# Patient Record
Sex: Male | Born: 1980
Health system: Southern US, Community
[De-identification: ages and names within clinical notes are randomized; demographics above are authoritative.]

## PROBLEM LIST (undated history)

## (undated) DIAGNOSIS — F419 Anxiety disorder, unspecified: Secondary | ICD-10-CM

## (undated) DIAGNOSIS — E039 Hypothyroidism, unspecified: Secondary | ICD-10-CM

---

## 2006-03-03 ENCOUNTER — Ambulatory Visit (HOSPITAL_COMMUNITY): Admission: RE | Admit: 2006-03-03 | Discharge: 2006-03-03 | Payer: Self-pay | Admitting: Family Medicine

## 2010-06-01 ENCOUNTER — Emergency Department (HOSPITAL_COMMUNITY)
Admission: EM | Admit: 2010-06-01 | Discharge: 2010-06-01 | Disposition: A | Discharge status: Home / Self Care | Payer: BC Managed Care – PPO | Attending: Emergency Medicine | Admitting: Emergency Medicine

## 2010-06-01 DIAGNOSIS — H6500 Acute serous otitis media, unspecified ear: Secondary | ICD-10-CM | POA: Insufficient documentation

## 2010-06-01 DIAGNOSIS — H9209 Otalgia, unspecified ear: Secondary | ICD-10-CM | POA: Insufficient documentation

## 2010-06-01 DIAGNOSIS — H60399 Other infective otitis externa, unspecified ear: Secondary | ICD-10-CM | POA: Insufficient documentation

## 2010-06-26 ENCOUNTER — Other Ambulatory Visit: Payer: Self-pay | Admitting: Urology

## 2010-06-26 ENCOUNTER — Other Ambulatory Visit (HOSPITAL_COMMUNITY)
Admission: RE | Admit: 2010-06-26 | Discharge: 2010-06-26 | Disposition: A | Discharge status: Home / Self Care | Payer: BC Managed Care – PPO | Source: Ambulatory Visit | Attending: Urology | Admitting: Urology

## 2010-06-26 DIAGNOSIS — Z9852 Vasectomy status: Secondary | ICD-10-CM | POA: Insufficient documentation

## 2011-03-23 ENCOUNTER — Emergency Department (HOSPITAL_COMMUNITY)
Admission: EM | Admit: 2011-03-23 | Discharge: 2011-03-23 | Disposition: A | Discharge status: Home / Self Care | Payer: Worker's Compensation | Attending: Emergency Medicine | Admitting: Emergency Medicine

## 2011-03-23 ENCOUNTER — Encounter: Payer: Self-pay | Admitting: Emergency Medicine

## 2011-03-23 ENCOUNTER — Emergency Department (HOSPITAL_COMMUNITY): Payer: Worker's Compensation

## 2011-03-23 DIAGNOSIS — W230XXA Caught, crushed, jammed, or pinched between moving objects, initial encounter: Secondary | ICD-10-CM | POA: Insufficient documentation

## 2011-03-23 DIAGNOSIS — Z23 Encounter for immunization: Secondary | ICD-10-CM | POA: Insufficient documentation

## 2011-03-23 DIAGNOSIS — S8010XA Contusion of unspecified lower leg, initial encounter: Secondary | ICD-10-CM

## 2011-03-23 DIAGNOSIS — S81819A Laceration without foreign body, unspecified lower leg, initial encounter: Secondary | ICD-10-CM

## 2011-03-23 DIAGNOSIS — S81009A Unspecified open wound, unspecified knee, initial encounter: Secondary | ICD-10-CM | POA: Insufficient documentation

## 2011-03-23 DIAGNOSIS — S71109A Unspecified open wound, unspecified thigh, initial encounter: Secondary | ICD-10-CM | POA: Insufficient documentation

## 2011-03-23 DIAGNOSIS — S71009A Unspecified open wound, unspecified hip, initial encounter: Secondary | ICD-10-CM | POA: Insufficient documentation

## 2011-03-23 DIAGNOSIS — J45909 Unspecified asthma, uncomplicated: Secondary | ICD-10-CM | POA: Insufficient documentation

## 2011-03-23 DIAGNOSIS — S7010XA Contusion of unspecified thigh, initial encounter: Secondary | ICD-10-CM | POA: Insufficient documentation

## 2011-03-23 DIAGNOSIS — T148XXA Other injury of unspecified body region, initial encounter: Secondary | ICD-10-CM

## 2011-03-23 DIAGNOSIS — Y9269 Other specified industrial and construction area as the place of occurrence of the external cause: Secondary | ICD-10-CM | POA: Insufficient documentation

## 2011-03-23 MED ORDER — HYDROCODONE-ACETAMINOPHEN 5-500 MG PO TABS: 1.0000 | ORAL_TABLET | Freq: Four times a day (QID) | ORAL | Status: AC | PRN

## 2011-03-23 MED ORDER — IBUPROFEN 800 MG PO TABS: 800.0000 mg | ORAL_TABLET | Freq: Three times a day (TID) | ORAL | Status: AC

## 2011-03-23 MED ORDER — HYDROCODONE-ACETAMINOPHEN 5-325 MG PO TABS
1.0000 | ORAL_TABLET | Freq: Once | ORAL | Status: AC
  Administered 2011-03-23: 1 via ORAL
  Filled 2011-03-23: qty 1

## 2011-03-23 MED ORDER — TETANUS-DIPHTH-ACELL PERTUSSIS 5-2.5-18.5 LF-MCG/0.5 IM SUSP
0.5000 mL | Freq: Once | INTRAMUSCULAR | Status: AC
  Administered 2011-03-23: 0.5 mL via INTRAMUSCULAR
  Filled 2011-03-23 (×2): qty 0.5

## 2011-03-23 MED ORDER — IBUPROFEN 800 MG PO TABS
800.0000 mg | ORAL_TABLET | Freq: Once | ORAL | Status: AC
  Administered 2011-03-23: 800 mg via ORAL
  Filled 2011-03-23: qty 1

## 2011-03-23 NOTE — ED Notes (Signed)
Per pt, pt works for [employer], Warehouse manager, Nathaniel Dawson at bedside with pt - states company does not require drug screen.

## 2011-03-23 NOTE — ED Notes (Signed)
Worker's comp form filled out by [employer], reviewed with pt and pt signed, verbalized understanding.  Copy of form given to employer, pt, and copy placed in chart.

## 2011-03-23 NOTE — ED Notes (Signed)
Dressing applied to wounds on left leg, tefla, and stretch gauze applied. No drainage noted.

## 2011-03-23 NOTE — ED Provider Notes (Signed)
History    This chart was scribed for [employer]. Colon Branch, MD, MD by Smitty Pluck. The patient was seen in room APA10 and the patient's care was started at 8:05AM.   CSN: [id number] Arrival date & time: 03/23/2011  7:32 AM   First MD Initiated Contact with Patient 03/23/11 0747      Chief Complaint  Patient presents with  . Leg Injury    (Consider location/radiation/quality/duration/timing/severity/associated sxs/prior treatment) The history is provided by the patient and a parent.   Nathaniel Dawson is a 30 y.o. male who presents to the Emergency Department BIB ambulance complaining of left thigh injury after leg was pressed against sheet metal on crane at work onset 1 hour ago today. Pt's pain is moderate and numbness in left leg. Pt's father states that tetanus shot is not up to date. Pt reports that he is able to walk but walking aggravates the pain. PCP is Dr. Renette Butters  Past Medical History  Diagnosis Date  . Asthma     History reviewed. No pertinent past surgical history.  History reviewed. No pertinent family history.  History  Substance Use Topics  . Smoking status: Never Smoker   . Smokeless tobacco: Not on file  . Alcohol Use: No      Review of Systems  All other systems reviewed and are negative.   10 Systems reviewed and are negative for acute change except as noted in the HPI.  Allergies  Aspirin  Home Medications  No current outpatient prescriptions on file.  BP 135/83  Pulse 81  Temp 97.9 F (36.6 C)  Resp 20  Ht 6' (1.829 m)  Wt 150 lb (68.04 kg)  BMI 20.34 kg/m2  SpO2 97%  Physical Exam  Nursing note and vitals reviewed. Constitutional: He is oriented to person, place, and time. He appears well-developed and well-nourished. No distress.  HENT:  Head: Normocephalic and atraumatic.  Right Ear: External ear normal.  Left Ear: External ear normal.  Eyes: EOM are normal. Pupils are equal, round, and reactive to light.  Neck: Normal range of  motion. Neck supple. No tracheal deviation present.  Cardiovascular: Normal rate and normal heart sounds.   Pulmonary/Chest: Effort normal and breath sounds normal. No respiratory distress. He has no wheezes.  Abdominal: Soft. Bowel sounds are normal. He exhibits no distension. There is no tenderness.  Musculoskeletal: Normal range of motion. He exhibits no edema.       Directly 6 cm horizontal superficial laceration behind left knee 18 cm superficial laceration with surrounding bruising on right thigh  DP and DT pulses good Full  range of motion hip knee joint  Neurological: He is alert and oriented to person, place, and time.  Skin: Skin is warm and dry.  Psychiatric: He has a normal mood and affect. His behavior is normal.    ED Course  Procedures (including critical care time)  DIAGNOSTIC STUDIES: Oxygen Saturation is 97% on room air, normal by my interpretation.    COORDINATION OF CARE: 9:32Am Recheck: Dr discussed xray results and treatment course. Pt is feeling better and understands treatment plan. Pt is ready for discharge.   Labs Reviewed - No data to display Dg Femur Left  03/23/2011  *RADIOLOGY REPORT*  Clinical Data: Crush injury to the left upper leg.  Bruising.  LEFT FEMUR - 2 VIEW  Comparison: None.  Findings: No fracture or other significant abnormality.  IMPRESSION: Normal exam.  Original Report Authenticated By: Gwynn Burly, M.D.  MDM  Patient with contusion to thigh as a result of crush injury. Superficiall lacerations that do not require repair. Xray negative for acute bone injury. Pt feels improved after observation and/or treatment in ED.Pt stable in ED with no significant deterioration in condition.The patient appears reasonably screened and/or stabilized for discharge and I doubt any other medical condition or other [facility] requiring further screening, evaluation, or treatment in the ED at this time prior to discharge.  I personally performed the  services described in this documentation, which was scribed in my presence. The recorded information has been reviewed and considered.  MDM Reviewed: nursing note and vitals Interpretation: x-ray         Nicoletta Dress. Colon Branch, MD 03/23/11 1001

## 2011-03-23 NOTE — ED Notes (Signed)
MD at bedside. 

## 2011-03-23 NOTE — ED Notes (Signed)
Pt c/o left thigh pain after leg was pressed between object on crane and metal sheets. Pt ambulated into ed. nad noted.

## 2011-03-23 NOTE — ED Notes (Signed)
Pt reports hitting his left leg on a piece of metal this morning at work. Pt with abrasions to posterior side of leg. Pt reports numbness and tingling down left left. Pt with steady gait. Pt reports pain 8/10 and states that pain is relieved by sitting.

## 2011-03-23 NOTE — ED Notes (Signed)
Patient transported to X-ray 

## 2014-09-13 ENCOUNTER — Encounter (HOSPITAL_COMMUNITY): Payer: Self-pay

## 2014-09-13 ENCOUNTER — Emergency Department (HOSPITAL_COMMUNITY)
Admission: EM | Admit: 2014-09-13 | Discharge: 2014-09-13 | Disposition: A | Discharge status: Home / Self Care | Payer: BLUE CROSS/BLUE SHIELD | Attending: Emergency Medicine | Admitting: Emergency Medicine

## 2014-09-13 DIAGNOSIS — Z79899 Other long term (current) drug therapy: Secondary | ICD-10-CM | POA: Diagnosis not present

## 2014-09-13 DIAGNOSIS — B029 Zoster without complications: Secondary | ICD-10-CM | POA: Insufficient documentation

## 2014-09-13 DIAGNOSIS — J45909 Unspecified asthma, uncomplicated: Secondary | ICD-10-CM | POA: Insufficient documentation

## 2014-09-13 DIAGNOSIS — R079 Chest pain, unspecified: Secondary | ICD-10-CM | POA: Insufficient documentation

## 2014-09-13 DIAGNOSIS — R21 Rash and other nonspecific skin eruption: Secondary | ICD-10-CM | POA: Diagnosis present

## 2014-09-13 MED ORDER — HYDROCODONE-ACETAMINOPHEN 5-325 MG PO TABS: ORAL_TABLET | ORAL | Status: DC

## 2014-09-13 MED ORDER — ACYCLOVIR 800 MG PO TABS
800.0000 mg | ORAL_TABLET | Freq: Once | ORAL | Status: AC
  Administered 2014-09-13: 800 mg via ORAL
  Filled 2014-09-13: qty 1

## 2014-09-13 MED ORDER — ACYCLOVIR 800 MG PO TABS: 800.0000 mg | ORAL_TABLET | Freq: Every day | ORAL | Status: DC

## 2014-09-13 NOTE — Discharge Instructions (Signed)

## 2014-09-13 NOTE — ED Notes (Signed)
Patient c/o rash and red area to left rib/chest. Also c/o discomfort to that area as well.

## 2014-09-15 NOTE — ED Provider Notes (Signed)
CSN: [id number]     Arrival date & time 09/13/14  2014 History   First MD Initiated Contact with Patient 09/13/14 2035     Chief Complaint  Patient presents with  . Rash     (Consider location/radiation/quality/duration/timing/severity/associated sxs/prior Treatment) HPI   Nathaniel Dawson is a 34 y.o. male who presents to the Emergency Department complaining of sudden onset of painful rash to the left mid back and left mid chest.  He notes having pain to his ribs that began one day prior to onset of rash.  He denies itching, but describes the rash as intermittent sharp, "pulling" type pain.  Nothing makes the pain better or worse.  He denies cough, fever, abdominal pain or shortness of breath.     Past Medical History  Diagnosis Date  . Asthma    History reviewed. No pertinent past surgical history. History reviewed. No pertinent family history. History  Substance Use Topics  . Smoking status: Never Smoker   . Smokeless tobacco: Not on file  . Alcohol Use: No    Review of Systems  Constitutional: Negative for fever, chills, activity change and appetite change.  HENT: Negative for facial swelling, sore throat and trouble swallowing.   Respiratory: Negative for chest tightness, shortness of breath and wheezing.   Cardiovascular: Positive for chest pain.  Gastrointestinal: Negative for nausea and vomiting.  Musculoskeletal: Negative for neck pain and neck stiffness.  Skin: Positive for rash. Negative for wound.  Neurological: Negative for dizziness, weakness, numbness and headaches.  All other systems reviewed and are negative.     Allergies  Aspirin  Home Medications   Prior to Admission medications   Medication Sig Start Date End Date Taking? Authorizing Provider  acyclovir (ZOVIRAX) 800 MG tablet Take 1 tablet (800 mg total) by mouth 5 (five) times daily. For 10 days 09/13/14   Shadeed Colberg, PA-C  albuterol (PROVENTIL HFA;VENTOLIN HFA) 108 (90 BASE) MCG/ACT  inhaler Inhale 2 puffs into the lungs every 6 (six) hours as needed. For shortness of  breath     Historical Provider, MD  diphenhydrAMINE (BENADRYL) 25 MG tablet Take 25 mg by mouth every 6 (six) hours as needed. For allergies     Historical Provider, MD  HYDROcodone-acetaminophen (NORCO/VICODIN) 5-325 MG per tablet Take one-two tabs po q 4-6 hrs prn pain 09/13/14   Bela Nyborg, PA-C   BP 151/98 mmHg  Pulse 105  Temp(Src) 98.3 F (36.8 C) (Oral)  Resp 20  Wt 140 lb (63.504 kg)  SpO2 99% Physical Exam  Constitutional: He is oriented to person, place, and time. He appears well-developed and well-nourished. No distress.  HENT:  Head: Normocephalic and atraumatic.  Mouth/Throat: Oropharynx is clear and moist.  Neck: Normal range of motion. Neck supple.  Cardiovascular: Normal rate, regular rhythm, normal heart sounds and intact distal pulses.   No murmur heard. Pulmonary/Chest: Effort normal and breath sounds normal. No respiratory distress.  Abdominal: Soft. He exhibits no distension. There is no tenderness. There is no rebound and no guarding.  Musculoskeletal: He exhibits no edema or tenderness.  Lymphadenopathy:    He has no cervical adenopathy.  Neurological: He is alert and oriented to person, place, and time. He exhibits normal muscle tone. Coordination normal.  Skin: Skin is warm. Rash noted. There is erythema.  Grouped erythematous vesicular rash at the mid left back and left mid anterior chest.    Nursing note and vitals reviewed.   ED Course  Procedures (including critical care time)  Labs Review Labs Reviewed - No data to display  Imaging Review No results found.   EKG Interpretation None      MDM   Final diagnoses:  Herpes zoster    Pt is well appearing.  Vitals stable.  Rash appears c/w herpes zoster.  Pt agrees to tx with anti-viral and pain medication, pt also agrees to close PMD f/u.      Pauline Ausammy Inanna Telford, PA-C 09/15/14 2324  Donnetta HutchingBrian Cook,  MD 09/15/14 2337

## 2015-07-30 ENCOUNTER — Ambulatory Visit (INDEPENDENT_AMBULATORY_CARE_PROVIDER_SITE_OTHER): Payer: BLUE CROSS/BLUE SHIELD | Admitting: Family Medicine

## 2015-07-30 ENCOUNTER — Encounter: Payer: Self-pay | Admitting: Family Medicine

## 2015-07-30 VITALS — BP 120/80 | Temp 98.5°F | Wt 143.0 lb

## 2015-07-30 DIAGNOSIS — J029 Acute pharyngitis, unspecified: Secondary | ICD-10-CM

## 2015-07-30 DIAGNOSIS — J020 Streptococcal pharyngitis: Secondary | ICD-10-CM | POA: Diagnosis not present

## 2015-07-30 LAB — POCT RAPID STREP A (OFFICE): Rapid Strep A Screen: POSITIVE — AB

## 2015-07-30 MED ORDER — AZITHROMYCIN 250 MG PO TABS: ORAL_TABLET | ORAL | Status: DC

## 2015-07-30 NOTE — Progress Notes (Signed)
   Subjective:    Patient ID: Nathaniel Dawson, male    DOB: 10/18/1980, 35 y.o.   MRN: [id number]  Sore Throat  This is a new problem. The current episode started in the past 7 days. The problem has been unchanged. Neither side of throat is experiencing more pain than the other. There has been no fever. The pain is moderate. Associated symptoms include ear pain. He has tried nothing for the symptoms. The treatment provided no relief.   Four d ago, some swelling with swallowing and some discomfort  No sig cough  Works shop work loading trucks   No fever no chills some disc with swallowing  Results for orders placed or performed in visit on 07/30/15  POCT rapid strep A  Result Value Ref Range   Rapid Strep A Screen Positive (A) Negative    Tooth brush exposure with know kn strep    Review of Systems  HENT: Positive for ear pain.    no vomiting no diarrhea     Objective:   Physical Exam   alert vital stable hydration good H&T normal  Other thanpharynx slight erythematous some tender anterior nodes neck supple lungs clear. Heart regular in rhythm.      Assessment & Plan:   impression acute strep throat multiple questions answered plan antibiotics prescribed. Symptom care discussed warning signs discuss

## 2015-08-05 ENCOUNTER — Other Ambulatory Visit: Payer: Self-pay | Admitting: *Deleted

## 2015-08-05 ENCOUNTER — Telehealth: Payer: Self-pay | Admitting: Family Medicine

## 2015-08-05 MED ORDER — AMOXICILLIN 500 MG PO CAPS: 500.0000 mg | ORAL_CAPSULE | Freq: Three times a day (TID) | ORAL | Status: DC

## 2015-08-05 NOTE — Telephone Encounter (Signed)
Pt was seen last Tuesday for strep. Patient was given a zpac. Pt states that it is now worse and wants to know if something else can be called in.      [facility]

## 2015-08-05 NOTE — Telephone Encounter (Signed)
amox 500 tid ten d 

## 2015-08-05 NOTE — Telephone Encounter (Signed)
Seen 4/11. Positive for strep. Pt finished zpack on Saturday. Feels about the same as when seen. Never had a sore throat. Headache and swollen tonsils. Little cough, no congestion, no fever, no trouble breathing.

## 2015-08-05 NOTE — Telephone Encounter (Signed)
Med sent to pharm. Pt notified.  

## 2015-08-23 ENCOUNTER — Ambulatory Visit (INDEPENDENT_AMBULATORY_CARE_PROVIDER_SITE_OTHER): Payer: BLUE CROSS/BLUE SHIELD | Admitting: Family Medicine

## 2015-08-23 ENCOUNTER — Encounter: Payer: Self-pay | Admitting: Family Medicine

## 2015-08-23 VITALS — BP 108/62 | Wt 141.6 lb

## 2015-08-23 DIAGNOSIS — R5383 Other fatigue: Secondary | ICD-10-CM

## 2015-08-23 DIAGNOSIS — E039 Hypothyroidism, unspecified: Secondary | ICD-10-CM

## 2015-08-23 DIAGNOSIS — Z13220 Encounter for screening for lipoid disorders: Secondary | ICD-10-CM | POA: Diagnosis not present

## 2015-08-23 NOTE — Progress Notes (Signed)
   Subjective:    Patient ID: Nathaniel Dawson, male    DOB: 09/07/1980, 35 y.o.   MRN: [id number] Patient presents for a protracted discussion regarding diffuse and somewhat vague symptomatology. HPI Last October fel chilled and puffy in face, was seen in oct and thyr was tested, b w showed possiblr low thyroid  Pt feling sick at times describes assisted diminished energy does not feel his best. Wonders if he is "just getting older"  Finds himself running out of energy a lot in usual. A lot more fatigue than usual.  Was seen at another clinicians office. Was started on thyroid medication due to slightly elevated TSH. Started getting even more symptoms and was wondering if the medicine may be related. Patient stopped the medicine. Now I wonders whether that was a good idea or not.  Felt bad and achey Patient arrives to discuss thyroid issues. Patient states he was on synthroid 50 mcg but stopped on his own 3 months ago.   Patient felt the meds were causing side effects but patient still having the side effects without the med.  Also admits to a bit of a challenge with anxiety. States was put on medication for this. Is pretty much come off the medicine. He realizes that anxiety is a challenge for him at times. Was on a serotonin builder along with when necessary Xanax. Patient would prefer not to go back on these at all possible  Review of Systems No headache, no major weight loss or weight gain, no chest pain no back pain abdominal pain no change in bowel habits complete ROS otherwise negative     Objective:   Physical Exam  Alert vital stable no apparent distress anxious appearing HEENT normal thyroid nonpalpable lungs clear. Heart regular rhythm abdomen      Assessment & Plan:  Impression vague symptomatology including fatigue and anxiety with profound fatigue at times. Patient worried about this. Plan appropriate blood work. Diet and exercise discussed. Hold off on thyroid medicine  and results 25 minutes spent most in discussion

## 2015-08-24 LAB — CBC WITH DIFFERENTIAL/PLATELET
Basophils Absolute: 0 10*3/uL (ref 0.0–0.2)
Basos: 0 %
EOS (ABSOLUTE): 0.3 10*3/uL (ref 0.0–0.4)
Eos: 5 %
Hematocrit: 44.6 % (ref 37.5–51.0)
Hemoglobin: 15.3 g/dL (ref 12.6–17.7)
Immature Grans (Abs): 0 10*3/uL (ref 0.0–0.1)
Immature Granulocytes: 0 %
Lymphocytes Absolute: 1.7 10*3/uL (ref 0.7–3.1)
Lymphs: 36 %
MCH: 29.7 pg (ref 26.6–33.0)
MCHC: 34.3 g/dL (ref 31.5–35.7)
MCV: 86 fL (ref 79–97)
Monocytes Absolute: 0.5 10*3/uL (ref 0.1–0.9)
Monocytes: 11 %
Neutrophils Absolute: 2.2 10*3/uL (ref 1.4–7.0)
Neutrophils: 48 %
Platelets: 214 10*3/uL (ref 150–379)
RBC: 5.16 x10E6/uL (ref 4.14–5.80)
RDW: 13.5 % (ref 12.3–15.4)
WBC: 4.6 10*3/uL (ref 3.4–10.8)

## 2015-08-24 LAB — HEPATIC FUNCTION PANEL
ALT: 10 IU/L (ref 0–44)
AST: 17 IU/L (ref 0–40)
Albumin: 5.1 g/dL (ref 3.5–5.5)
Alkaline Phosphatase: 54 IU/L (ref 39–117)
Bilirubin Total: 0.9 mg/dL (ref 0.0–1.2)
Bilirubin, Direct: 0.2 mg/dL (ref 0.00–0.40)
Total Protein: 7.2 g/dL (ref 6.0–8.5)

## 2015-08-24 LAB — BASIC METABOLIC PANEL
BUN/Creatinine Ratio: 17 (ref 9–20)
BUN: 14 mg/dL (ref 6–20)
CO2: 24 mmol/L (ref 18–29)
Calcium: 9.8 mg/dL (ref 8.7–10.2)
Chloride: 100 mmol/L (ref 96–106)
Creatinine, Ser: 0.82 mg/dL (ref 0.76–1.27)
GFR calc Af Amer: 132 mL/min/{1.73_m2} (ref >59)
GFR calc non Af Amer: 115 mL/min/{1.73_m2} (ref >59)
Glucose: 90 mg/dL (ref 65–99)
Potassium: 4.2 mmol/L (ref 3.5–5.2)
Sodium: 141 mmol/L (ref 134–144)

## 2015-08-24 LAB — LIPID PANEL
Chol/HDL Ratio: 3.6 ratio units (ref 0.0–5.0)
Cholesterol, Total: 186 mg/dL (ref 100–199)
HDL: 51 mg/dL (ref >39)
LDL Calculated: 119 mg/dL — ABNORMAL HIGH (ref 0–99)
Triglycerides: 80 mg/dL (ref 0–149)
VLDL Cholesterol Cal: 16 mg/dL (ref 5–40)

## 2015-08-24 LAB — TSH: TSH: 6.32 u[IU]/mL — ABNORMAL HIGH (ref 0.450–4.500)

## 2015-08-24 LAB — T4, FREE: Free T4: 0.99 ng/dL (ref 0.82–1.77)

## 2015-08-26 MED ORDER — LEVOTHYROXINE SODIUM 50 MCG PO TABS: 50.0000 ug | ORAL_TABLET | Freq: Every day | ORAL | Status: DC

## 2015-08-26 NOTE — Addendum Note (Signed)
Addended by: Margaretha SheffieldBROWN, AUTUMN S on: 08/26/2015 11:17 AM   Modules accepted: Orders

## 2015-11-01 DIAGNOSIS — E039 Hypothyroidism, unspecified: Secondary | ICD-10-CM | POA: Diagnosis not present

## 2015-11-02 LAB — T4, FREE: Free T4: 1.26 ng/dL (ref 0.82–1.77)

## 2015-11-02 LAB — TSH: TSH: 3.58 u[IU]/mL (ref 0.450–4.500)

## 2015-12-20 ENCOUNTER — Encounter: Payer: Self-pay | Admitting: Family Medicine

## 2015-12-20 ENCOUNTER — Ambulatory Visit (INDEPENDENT_AMBULATORY_CARE_PROVIDER_SITE_OTHER): Payer: BLUE CROSS/BLUE SHIELD | Admitting: Family Medicine

## 2015-12-20 VITALS — BP 132/86 | Temp 98.1°F | Ht 60.0 in | Wt 144.6 lb

## 2015-12-20 DIAGNOSIS — R5383 Other fatigue: Secondary | ICD-10-CM

## 2015-12-20 NOTE — Progress Notes (Signed)
   Subjective:    Patient ID: Nathaniel Dawson, male    DOB: 09/12/1980, 35 y.o.   MRN: [id number]  HPIFatigue and weak for about one year. Anxiety. Pt thinks it may be his synthroid. Pt notes ongoing challenges with fatigue and tiredness  Patient goes to great length about the fact that fatigue comes and goes for him. On further history he works in a very hot environment. No air conditioning. By the end of the week he is very tired.  Condition patient works night shift. He reports this is less stressful but he realizes messages with his sleep. This also contributes to fatigue   Gets tired night shift,  Hot environment  [place]  No A C, but fans  Sleep not the best   On weekend stays up to get fatigue to sleep      Review of Systems No headache, no major weight loss or weight gain, no chest pain no back pain abdominal pain no change in bowel habits complete ROS otherwise negative     Objective:   Physical Exam  Alert vitals stable, NAD. Blood pressure good on repeat. HEENT normal. Lungs clear. Heart regular rate and rhythm.       Assessment & Plan:  Impression chronic heat exhaustion compounded by sleep deprivation discussed at length. Patient thinks his thyroid is okay and I'm inclined to think the same thing patient to work hard on trying to stay hydrated get adequate rest etc.

## 2016-01-03 DIAGNOSIS — E039 Hypothyroidism, unspecified: Secondary | ICD-10-CM | POA: Diagnosis not present

## 2016-01-03 DIAGNOSIS — Z1389 Encounter for screening for other disorder: Secondary | ICD-10-CM | POA: Diagnosis not present

## 2016-01-03 DIAGNOSIS — Z6821 Body mass index (BMI) 21.0-21.9, adult: Secondary | ICD-10-CM | POA: Diagnosis not present

## 2016-01-31 DIAGNOSIS — E039 Hypothyroidism, unspecified: Secondary | ICD-10-CM | POA: Diagnosis not present

## 2016-01-31 DIAGNOSIS — D1801 Hemangioma of skin and subcutaneous tissue: Secondary | ICD-10-CM | POA: Diagnosis not present

## 2016-01-31 DIAGNOSIS — Z1389 Encounter for screening for other disorder: Secondary | ICD-10-CM | POA: Diagnosis not present

## 2016-01-31 DIAGNOSIS — Z6821 Body mass index (BMI) 21.0-21.9, adult: Secondary | ICD-10-CM | POA: Diagnosis not present

## 2016-02-14 DIAGNOSIS — E039 Hypothyroidism, unspecified: Secondary | ICD-10-CM | POA: Diagnosis not present

## 2016-02-14 DIAGNOSIS — Z1389 Encounter for screening for other disorder: Secondary | ICD-10-CM | POA: Diagnosis not present

## 2016-02-14 DIAGNOSIS — Z6821 Body mass index (BMI) 21.0-21.9, adult: Secondary | ICD-10-CM | POA: Diagnosis not present

## 2016-04-09 DIAGNOSIS — E039 Hypothyroidism, unspecified: Secondary | ICD-10-CM | POA: Diagnosis not present

## 2016-07-03 DIAGNOSIS — Z6822 Body mass index (BMI) 22.0-22.9, adult: Secondary | ICD-10-CM | POA: Diagnosis not present

## 2016-07-03 DIAGNOSIS — S6982XA Other specified injuries of left wrist, hand and finger(s), initial encounter: Secondary | ICD-10-CM | POA: Diagnosis not present

## 2016-07-03 DIAGNOSIS — E039 Hypothyroidism, unspecified: Secondary | ICD-10-CM | POA: Diagnosis not present

## 2016-07-03 DIAGNOSIS — Z1389 Encounter for screening for other disorder: Secondary | ICD-10-CM | POA: Diagnosis not present

## 2016-07-03 DIAGNOSIS — E063 Autoimmune thyroiditis: Secondary | ICD-10-CM | POA: Diagnosis not present

## 2016-11-13 DIAGNOSIS — F419 Anxiety disorder, unspecified: Secondary | ICD-10-CM | POA: Diagnosis not present

## 2016-11-13 DIAGNOSIS — Z6823 Body mass index (BMI) 23.0-23.9, adult: Secondary | ICD-10-CM | POA: Diagnosis not present

## 2016-11-13 DIAGNOSIS — R7309 Other abnormal glucose: Secondary | ICD-10-CM | POA: Diagnosis not present

## 2016-11-13 DIAGNOSIS — J4520 Mild intermittent asthma, uncomplicated: Secondary | ICD-10-CM | POA: Diagnosis not present

## 2016-11-13 DIAGNOSIS — E039 Hypothyroidism, unspecified: Secondary | ICD-10-CM | POA: Diagnosis not present

## 2016-11-13 DIAGNOSIS — Z1389 Encounter for screening for other disorder: Secondary | ICD-10-CM | POA: Diagnosis not present

## 2017-03-19 DIAGNOSIS — E063 Autoimmune thyroiditis: Secondary | ICD-10-CM | POA: Diagnosis not present

## 2017-03-19 DIAGNOSIS — Z6823 Body mass index (BMI) 23.0-23.9, adult: Secondary | ICD-10-CM | POA: Diagnosis not present

## 2017-03-19 DIAGNOSIS — J4520 Mild intermittent asthma, uncomplicated: Secondary | ICD-10-CM | POA: Diagnosis not present

## 2017-03-19 DIAGNOSIS — Z1389 Encounter for screening for other disorder: Secondary | ICD-10-CM | POA: Diagnosis not present

## 2017-03-19 DIAGNOSIS — F419 Anxiety disorder, unspecified: Secondary | ICD-10-CM | POA: Diagnosis not present

## 2017-04-12 ENCOUNTER — Encounter: Payer: Self-pay | Admitting: Family Medicine

## 2017-04-12 ENCOUNTER — Ambulatory Visit: Payer: BLUE CROSS/BLUE SHIELD | Admitting: Family Medicine

## 2017-04-12 VITALS — BP 130/80 | HR 100 | Temp 98.9°F | Ht 72.0 in | Wt 160.8 lb

## 2017-04-12 DIAGNOSIS — R6889 Other general symptoms and signs: Secondary | ICD-10-CM

## 2017-04-12 DIAGNOSIS — B349 Viral infection, unspecified: Secondary | ICD-10-CM | POA: Diagnosis not present

## 2017-04-12 MED ORDER — AZITHROMYCIN 250 MG PO TABS: ORAL_TABLET | ORAL | 0 refills | Status: DC

## 2017-04-12 MED ORDER — AMOXICILLIN 500 MG PO TABS: 500.0000 mg | ORAL_TABLET | Freq: Three times a day (TID) | ORAL | 0 refills | Status: DC

## 2017-04-12 NOTE — Progress Notes (Signed)
   Subjective:    Patient ID: Nathaniel Dawson, male    DOB: 10/26/1980, 36 y.o.   MRN: [id number]  Headache   This is a new problem. The current episode started yesterday (Friday evening). The problem occurs constantly. The problem has been unchanged. The pain is located in the frontal region. The pain does not radiate. The quality of the pain is described as aching. Associated symptoms include coughing, ear pain, eye pain, a fever, muscle aches and a sore throat. He has tried nothing for the symptoms.   Headache body aches runny nose relates a little bit of fever denies any wheezing or difficulty breathing   Review of Systems  Constitutional: Positive for fever.  HENT: Positive for ear pain and sore throat.   Eyes: Positive for pain.  Respiratory: Positive for cough.   Neurological: Positive for headaches.       Objective:   Physical Exam  Constitutional: He appears well-developed.  HENT:  Head: Normocephalic.  Mouth/Throat: Oropharynx is clear and moist. No oropharyngeal exudate.  Neck: Normal range of motion.  Cardiovascular: Normal rate, regular rhythm and normal heart sounds.  No murmur heard. Pulmonary/Chest: Effort normal and breath sounds normal. He has no wheezes.  Lymphadenopathy:    He has no cervical adenopathy.  Neurological: He exhibits normal muscle tone.  Skin: Skin is warm and dry.  Nursing note and vitals reviewed.         Assessment & Plan:  Viral syndrome Possible parainfluenza Rest up over the next few days Secondary sinusitis is possible as a result of all this.  I recommend a printed prescription of amoxicillin to get filled if symptoms progress call us if any issues

## 2017-09-17 DIAGNOSIS — E039 Hypothyroidism, unspecified: Secondary | ICD-10-CM | POA: Diagnosis not present

## 2017-09-17 DIAGNOSIS — E063 Autoimmune thyroiditis: Secondary | ICD-10-CM | POA: Diagnosis not present

## 2017-09-17 DIAGNOSIS — Z0000 Encounter for general adult medical examination without abnormal findings: Secondary | ICD-10-CM | POA: Diagnosis not present

## 2017-09-17 DIAGNOSIS — Z1389 Encounter for screening for other disorder: Secondary | ICD-10-CM | POA: Diagnosis not present

## 2017-09-17 DIAGNOSIS — Z6824 Body mass index (BMI) 24.0-24.9, adult: Secondary | ICD-10-CM | POA: Diagnosis not present

## 2017-12-29 ENCOUNTER — Emergency Department (HOSPITAL_COMMUNITY)
Admission: EM | Admit: 2017-12-29 | Discharge: 2017-12-30 | Disposition: A | Discharge status: Home / Self Care | Payer: Worker's Compensation | Attending: Emergency Medicine | Admitting: Emergency Medicine

## 2017-12-29 ENCOUNTER — Emergency Department (HOSPITAL_COMMUNITY): Payer: Worker's Compensation

## 2017-12-29 ENCOUNTER — Encounter (HOSPITAL_COMMUNITY): Payer: Self-pay | Admitting: Emergency Medicine

## 2017-12-29 ENCOUNTER — Other Ambulatory Visit: Payer: Self-pay

## 2017-12-29 DIAGNOSIS — Y929 Unspecified place or not applicable: Secondary | ICD-10-CM | POA: Insufficient documentation

## 2017-12-29 DIAGNOSIS — Y990 Civilian activity done for income or pay: Secondary | ICD-10-CM | POA: Diagnosis not present

## 2017-12-29 DIAGNOSIS — J45909 Unspecified asthma, uncomplicated: Secondary | ICD-10-CM | POA: Insufficient documentation

## 2017-12-29 DIAGNOSIS — Y939 Activity, unspecified: Secondary | ICD-10-CM | POA: Diagnosis not present

## 2017-12-29 DIAGNOSIS — S62645A Nondisplaced fracture of proximal phalanx of left ring finger, initial encounter for closed fracture: Secondary | ICD-10-CM | POA: Insufficient documentation

## 2017-12-29 DIAGNOSIS — S62644A Nondisplaced fracture of proximal phalanx of right ring finger, initial encounter for closed fracture: Secondary | ICD-10-CM | POA: Diagnosis not present

## 2017-12-29 DIAGNOSIS — E039 Hypothyroidism, unspecified: Secondary | ICD-10-CM | POA: Diagnosis not present

## 2017-12-29 DIAGNOSIS — Z79899 Other long term (current) drug therapy: Secondary | ICD-10-CM | POA: Diagnosis not present

## 2017-12-29 DIAGNOSIS — W231XXA Caught, crushed, jammed, or pinched between stationary objects, initial encounter: Secondary | ICD-10-CM | POA: Insufficient documentation

## 2017-12-29 DIAGNOSIS — S6992XA Unspecified injury of left wrist, hand and finger(s), initial encounter: Secondary | ICD-10-CM | POA: Diagnosis present

## 2017-12-29 HISTORY — DX: Hypothyroidism, unspecified: E03.9

## 2017-12-29 NOTE — ED Triage Notes (Signed)
Pt states he had his hand crushed in between 2 pieces of steel at work. Pt has swelling and redness the 4th finger.

## 2017-12-30 ENCOUNTER — Telehealth: Payer: Self-pay | Admitting: Orthopedic Surgery
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
## 2017-12-30 NOTE — Telephone Encounter (Signed)
Maisie Fushomas called this morning requesting an appointment to see Dr. Romeo AppleHarrison for a hand/finger injury.  He said this happened while at work and would be covered under [employer] insurance.  I asked him if he knew who the insurance carrier was or who the adjustor was on his case.  He said he did not.  I told him that I would have to get with the [employer] to get approval for him to be seen by Dr. Romeo AppleHarrison or see if they had another provider they prefer to send to.  He understood.  I spoke to Hostetterhomas again.  He stated that Catha GosselinSusan Bowman from [facility] is supposed to be his WC adjustor.  He gave her phone number as [phone]. [phone].  I have called and asked for her to call me ASAP.

## 2017-12-30 NOTE — Discharge Instructions (Signed)
Wear ulnar gutter splint as applied until followed up by orthopedics.  Ice for 20 minutes every 2 hours while awake for the next 2 days.  Ibuprofen 600 mg every 6 hours as needed for pain.  Follow-up with orthopedics in the next 3 days.  The contact information for Dr. Romeo AppleHarrison has been provided in this discharge summary for you to call and make these arrangements.

## 2017-12-30 NOTE — Telephone Encounter (Signed)
Dr. Romeo AppleHarrison please review this patient's chart and advise when to schedule him   Thanks

## 2017-12-31 NOTE — Telephone Encounter (Signed)
yes

## 2017-12-31 NOTE — Telephone Encounter (Signed)
I called the patient back this morning and gave him appointment for Monday, 01/03/18 at 11:00.  He said his hand and fingers were hurting.  He said all they gave him at the ER was Ibuprofen.  I told him if we had a cancellation for this morning I would call him and have him come in but as of right now, we didn't.  I reiterated again that if he did not hear back from me today, he is to be here on Monday around 11:00.  He said he would.

## 2018-01-03 ENCOUNTER — Ambulatory Visit: Payer: BLUE CROSS/BLUE SHIELD | Admitting: Orthopedic Surgery

## 2018-01-03 NOTE — Telephone Encounter (Signed)
Following my call to patient, received a call back approximately 1:40pm - patient states that Workers comp [place] (he apologized as he thought we would have been notified by them); we cancelled today's appointment.

## 2018-01-03 NOTE — Telephone Encounter (Signed)
Patient did not attend the appointment today, 01/03/18, as scheduled per notes. I called patient; reached voice mail - left message to return call regarding re-schedule.

## 2018-04-18 ENCOUNTER — Ambulatory Visit (INDEPENDENT_AMBULATORY_CARE_PROVIDER_SITE_OTHER): Payer: BLUE CROSS/BLUE SHIELD | Admitting: Family Medicine

## 2018-04-18 ENCOUNTER — Encounter: Payer: Self-pay | Admitting: Family Medicine

## 2018-04-18 VITALS — Temp 98.6°F | Ht 72.0 in | Wt 164.6 lb

## 2018-04-18 DIAGNOSIS — J329 Chronic sinusitis, unspecified: Secondary | ICD-10-CM | POA: Diagnosis not present

## 2018-04-18 DIAGNOSIS — J310 Chronic rhinitis: Secondary | ICD-10-CM

## 2018-04-18 MED ORDER — AMOXICILLIN 500 MG PO TABS: ORAL_TABLET | ORAL | 0 refills | Status: DC

## 2018-04-18 NOTE — Progress Notes (Signed)
   Subjective:    Patient ID: Nathaniel Dawson, male    DOB: 10/20/1980, 37 y.o.   MRN: [id number]  Cough  This is a new problem. The current episode started in the past 7 days. Associated symptoms include ear pain, headaches, nasal congestion and a sore throat.    Hit Thursday, sudden onset.  Diminished energy.  Possible fever.  Severe headache/frontal nature worse with cough  And di energy   And coough, comes and goes,  Hits hard  Notes congestion in the chest  Review of Systems  HENT: Positive for ear pain and sore throat.   Respiratory: Positive for cough.   Neurological: Positive for headaches.       Objective:   Physical Exam   Alert active good hydration with some nasal congestion.  TMs slight retraction left TM pharynx normal neck supple.  Lungs clear.  Heart regular rate and rhythm.  Occasional bronchial cough     Assessment & Plan:  Impression rhinosinusitis/bronchitis.  Flu in nature.  Discussed.  Plan antibiotics prescribed symptom care discussed warning signs discussed

## 2018-12-16 DIAGNOSIS — Z6822 Body mass index (BMI) 22.0-22.9, adult: Secondary | ICD-10-CM | POA: Diagnosis not present

## 2018-12-16 DIAGNOSIS — F411 Generalized anxiety disorder: Secondary | ICD-10-CM | POA: Diagnosis not present

## 2018-12-16 DIAGNOSIS — E039 Hypothyroidism, unspecified: Secondary | ICD-10-CM | POA: Diagnosis not present

## 2018-12-16 DIAGNOSIS — Z1389 Encounter for screening for other disorder: Secondary | ICD-10-CM | POA: Diagnosis not present
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
## 2019-04-17 DIAGNOSIS — F419 Anxiety disorder, unspecified: Secondary | ICD-10-CM | POA: Diagnosis not present

## 2019-04-17 DIAGNOSIS — Z6823 Body mass index (BMI) 23.0-23.9, adult: Secondary | ICD-10-CM | POA: Diagnosis not present

## 2019-04-17 DIAGNOSIS — E039 Hypothyroidism, unspecified: Secondary | ICD-10-CM | POA: Diagnosis not present

## 2019-04-17 DIAGNOSIS — J4520 Mild intermittent asthma, uncomplicated: Secondary | ICD-10-CM | POA: Diagnosis not present

## 2019-05-22 ENCOUNTER — Encounter: Payer: Self-pay | Admitting: Family Medicine

## 2019-12-15 DIAGNOSIS — Z6824 Body mass index (BMI) 24.0-24.9, adult: Secondary | ICD-10-CM | POA: Diagnosis not present

## 2019-12-15 DIAGNOSIS — E039 Hypothyroidism, unspecified: Secondary | ICD-10-CM | POA: Diagnosis not present

## 2020-09-04 DIAGNOSIS — Z20822 Contact with and (suspected) exposure to covid-19: Secondary | ICD-10-CM | POA: Diagnosis not present

## 2020-11-29 DIAGNOSIS — Z6825 Body mass index (BMI) 25.0-25.9, adult: Secondary | ICD-10-CM | POA: Diagnosis not present

## 2020-11-29 DIAGNOSIS — J4520 Mild intermittent asthma, uncomplicated: Secondary | ICD-10-CM | POA: Diagnosis not present

## 2020-11-29 DIAGNOSIS — E039 Hypothyroidism, unspecified: Secondary | ICD-10-CM | POA: Diagnosis not present

## 2020-11-29 DIAGNOSIS — F411 Generalized anxiety disorder: Secondary | ICD-10-CM | POA: Diagnosis not present

## 2020-11-29 DIAGNOSIS — E663 Overweight: Secondary | ICD-10-CM | POA: Diagnosis not present

## 2020-11-29 DIAGNOSIS — E782 Mixed hyperlipidemia: Secondary | ICD-10-CM | POA: Diagnosis not present

## 2023-03-01 ENCOUNTER — Encounter (HOSPITAL_COMMUNITY): Payer: Self-pay | Admitting: Emergency Medicine

## 2023-03-01 ENCOUNTER — Emergency Department (HOSPITAL_COMMUNITY)
Admission: EM | Admit: 2023-03-01 | Discharge: 2023-03-02 | Disposition: A | Discharge status: Home / Self Care | Payer: Self-pay | Attending: Student | Admitting: Student

## 2023-03-01 ENCOUNTER — Emergency Department (HOSPITAL_COMMUNITY): Payer: Self-pay

## 2023-03-01 ENCOUNTER — Other Ambulatory Visit: Payer: Self-pay

## 2023-03-01 DIAGNOSIS — W208XXA Other cause of strike by thrown, projected or falling object, initial encounter: Secondary | ICD-10-CM | POA: Diagnosis not present

## 2023-03-01 DIAGNOSIS — J45909 Unspecified asthma, uncomplicated: Secondary | ICD-10-CM | POA: Diagnosis not present

## 2023-03-01 DIAGNOSIS — S7012XA Contusion of left thigh, initial encounter: Secondary | ICD-10-CM | POA: Diagnosis not present

## 2023-03-01 DIAGNOSIS — S8992XA Unspecified injury of left lower leg, initial encounter: Secondary | ICD-10-CM | POA: Diagnosis present

## 2023-03-01 DIAGNOSIS — E039 Hypothyroidism, unspecified: Secondary | ICD-10-CM | POA: Insufficient documentation

## 2023-03-01 HISTORY — DX: Anxiety disorder, unspecified: F41.9

## 2023-03-01 NOTE — ED Triage Notes (Signed)
Pt here from work after a large piece of metal fell against pt's left upper leg, briefly pinning pt against a metal table 30 minutes ago. Pt states upper femur hurts into left buttocks. Pt is ambulatory and denies any broken skin or bleeding.

## 2023-03-02 NOTE — ED Provider Notes (Signed)
[place] [facility] AT [facility] Provider Note  CSN: [id number] Arrival date & time: 03/01/23 2244  Chief Complaint(s) Leg Injury  HPI Nathaniel Dawson is a 42 y.o. male with PMH anxiety, asthma, hypothyroidism who presents emergency department for evaluation of left leg injury.  Patient states that he was standing at work when a large metal beam fell and struck him in the thigh.  He denies being pinned under this metal beam for an extended period of time and currently endorses some pain over the thigh but denies numbness, tingling, weakness of the lower extremity.  Is able to bear weight on the extremity.  Denies any additional traumatic complaints including chest pain, shortness of breath, abdominal pain, nausea, vomiting, head strike, loss of consciousness or any other systemic, neurologic or traumatic complaints.   Past Medical History Past Medical History:  Diagnosis Date   Anxiety    Asthma    Hypothyroidism    There are no problems to display for this patient.  Home Medication(s) Prior to Admission medications   Medication Sig Start Date End Date Taking? Authorizing Provider  amoxicillin (AMOXIL) 500 MG tablet Take 1 tablet (500 mg total) by mouth 3 (three) times daily. Patient not taking: Reported on 04/18/2018 04/12/17   Babs Sciara, MD  amoxicillin (AMOXIL) 500 MG tablet One po Tid for 10 days 04/18/18   Merlyn Albert, MD  FLUoxetine (PROZAC) 10 MG tablet Take 10 mg by mouth daily.    [provider]  levothyroxine (SYNTHROID, LEVOTHROID) 75 MCG tablet TK 1 T PO D 03/26/17   [provider]                                                                                                                                    Past Surgical History History reviewed. No pertinent surgical history. Family History History reviewed. No pertinent family history.  Social History Social History   Tobacco Use   Smoking status: Never    Smokeless tobacco: Never  Vaping Use   Vaping status: Never Used  Substance Use Topics   Alcohol use: No   Drug use: No   Allergies Aspirin  Review of Systems Review of Systems  Musculoskeletal:  Positive for myalgias.    Physical Exam Vital Signs  I have reviewed the triage vital signs BP 122/86   Pulse 64   Temp 98.1 F (36.7 C) (Oral)   Resp 17   Ht 6' (1.829 m)   Wt 81.6 kg   SpO2 98%   BMI 24.41 kg/m   Physical Exam Constitutional:      General: He is not in acute distress.    Appearance: Normal appearance.  HENT:     Head: Normocephalic and atraumatic.     Nose: No congestion or rhinorrhea.  Eyes:     General:        Right eye: No discharge.  Left eye: No discharge.     Extraocular Movements: Extraocular movements intact.     Pupils: Pupils are equal, round, and reactive to light.  Cardiovascular:     Rate and Rhythm: Normal rate and regular rhythm.     Heart sounds: No murmur heard. Pulmonary:     Effort: No respiratory distress.     Breath sounds: No wheezing or rales.  Abdominal:     General: There is no distension.     Tenderness: There is no abdominal tenderness.  Musculoskeletal:        General: Tenderness present. Normal range of motion.     Cervical back: Normal range of motion.  Skin:    General: Skin is warm and dry.  Neurological:     General: No focal deficit present.     Mental Status: He is alert.     ED Results and Treatments Labs (all labs ordered are listed, but only abnormal results are displayed) Labs Reviewed - No data to display                                                                                                                        Radiology DG FEMUR MIN 2 VIEWS LEFT  Result Date: 03/02/2023 CLINICAL DATA:  [id number].  Left thigh injury. EXAM: LEFT FEMUR 2 VIEWS COMPARISON:  Left femoral series 03/23/2011 FINDINGS: There is no evidence of fracture or other focal bone lesions. Soft tissues are  unremarkable. No interval radiographic change is seen from 2012. IMPRESSION: Negative. Electronically Signed   By: Almira Bar M.D.   On: 03/02/2023 03:10    Pertinent labs & imaging results that were available during my care of the patient were reviewed by me and considered in my medical decision making (see MDM for details).  Medications Ordered in ED Medications - No data to display                                                                                                                                   Procedures Procedures  (including critical care time)  Medical Decision Making / ED Course   This patient presents to the ED for concern of leg injury, this involves an extensive number of treatment options, and is a complaint that carries with it a high risk of complications and morbidity.  The differential diagnosis includes contusion, hematoma, fracture, crush injury, morel lavallee  MDM:  Patient seen emergency room for evaluation of a thigh injury.  Physical exam with a small abrasion over the thigh but no appreciable deep space fluid collection likely hematoma, compartments are soft, neurologic exam unremarkable.  He has full range of motion and is able to bear weight on the limb.  X-ray imaging without fracture.  Low suspicion for prolonged crush injury and thus laboratory evaluation deferred.  Is not requiring any additional pain medicine in the ER and is able to bear weight and I have lower suspicion for subacute fracture or developing compartment syndrome.  He was given very strict return precautions and I extensively explained the symptoms of developing compartment syndrome at which she voiced understanding.  He currently does not meet inpatient criteria for admission and will be discharged with outpatient follow-up.   Additional history obtained:  -External records from outside source obtained and reviewed including: Chart review including previous notes, labs, imaging,  consultation notes      Imaging Studies ordered: I ordered imaging studies including x-ray femur I independently visualized and interpreted imaging. I agree with the radiologist interpretation   Medicines ordered and prescription drug management: No orders of the defined types were placed in this encounter.   -I have reviewed the patients home medicines and have made adjustments as needed  Critical interventions none   Social Determinants of Health:  Factors impacting patients care include: Injury occurred at work   Reevaluation: After the interventions noted above, I reevaluated the patient and found that they have :improved  Co morbidities that complicate the patient evaluation  Past Medical History:  Diagnosis Date   Anxiety    Asthma    Hypothyroidism       Dispostion: I considered admission for this patient, but at this time he does not meet inpatient criteria for admission and he will be discharged with outpatient follow-up     Final Clinical Impression(s) / ED Diagnoses Final diagnoses:  Contusion of left thigh, initial encounter     @PCDICTATION @    Glendora Score, MD 03/02/23 1840

## 2023-11-26 ENCOUNTER — Encounter: Payer: Self-pay | Admitting: Physician Assistant

## 2023-11-26 ENCOUNTER — Ambulatory Visit (INDEPENDENT_AMBULATORY_CARE_PROVIDER_SITE_OTHER): Payer: Self-pay | Admitting: Physician Assistant

## 2023-11-26 VITALS — BP 117/74 | HR 99 | Temp 99.8°F | Ht 72.0 in | Wt 170.0 lb

## 2023-11-26 DIAGNOSIS — K529 Noninfective gastroenteritis and colitis, unspecified: Secondary | ICD-10-CM

## 2023-11-26 DIAGNOSIS — N489 Disorder of penis, unspecified: Secondary | ICD-10-CM

## 2023-11-26 DIAGNOSIS — Z7689 Persons encountering health services in other specified circumstances: Secondary | ICD-10-CM

## 2023-11-26 DIAGNOSIS — E039 Hypothyroidism, unspecified: Secondary | ICD-10-CM

## 2023-11-26 DIAGNOSIS — Z113 Encounter for screening for infections with a predominantly sexual mode of transmission: Secondary | ICD-10-CM

## 2023-11-26 DIAGNOSIS — F411 Generalized anxiety disorder: Secondary | ICD-10-CM

## 2023-11-26 MED ORDER — ESCITALOPRAM OXALATE 20 MG PO TABS: 20.0000 mg | ORAL_TABLET | Freq: Every day | ORAL | 2 refills | Status: AC

## 2023-11-26 NOTE — Assessment & Plan Note (Signed)
 Stable. Denies symptomatology. TSH and T4 today. Will refill levothyroxine  based on lab results.

## 2023-11-26 NOTE — Assessment & Plan Note (Signed)
 Stable, doing well on Lexapro . Medication refilled today.

## 2023-11-26 NOTE — Assessment & Plan Note (Signed)
 Likely self-resolving infection, exam benign. CBC and CMP today.  Maintain good hydration, frequent small amounts of oral rehydration solution. Return for signs of dehydration, urinating less than 3 times in 24 hours.  Return if blood develops in stool or fever lasts longer than 5 days.  Hand washing to prevent spread of infection.

## 2023-11-26 NOTE — Assessment & Plan Note (Signed)
 Patient presents today with concerns for penile lesions x 2 weeks. 3-4 vesicular lesions with surrounding erythema present on distal aspect of penile shaft. No tenderness or penile discharge. Comprehensive STD testing today. Discussed supportive care and safe sex practices.

## 2023-11-26 NOTE — Progress Notes (Signed)
 New Patient Office Visit  Subjective    Patient ID: Nathaniel Dawson, male    DOB: 1981/03/04  Age: 43 y.o. MRN: [id number]  CC:  Chief Complaint  Patient presents with   Hypothyroidism    Med refills    Diarrhea    X 20 , nausea    HPI DAKING Dawson presents to establish care  Patient presents today with past medical history significant for hypothyroidism and generalized anxiety. He requests medication refill. Additionally, he reports 12 hours of nausea and diarrhea. States family members have also recently been sick with GI illnesses. He denies fevers, vomiting, or blood in stool. Staying adequately hydrated. Lastly, he reports sores on his penis. States he has been with a new partner for approximately 1 month now, and 2 weeks ago noticed 3 sores on the distal aspect of his penis. Denies dysuria or penile discharge. He is requesting STD testing today.   Outpatient Encounter Medications as of 11/26/2023  Medication Sig   levothyroxine  (SYNTHROID , LEVOTHROID) 75 MCG tablet TK 1 T PO D   [DISCONTINUED] escitalopram  (LEXAPRO ) 20 MG tablet Take 20 mg by mouth daily.   escitalopram  (LEXAPRO ) 20 MG tablet Take 1 tablet (20 mg total) by mouth daily.   [DISCONTINUED] amoxicillin  (AMOXIL ) 500 MG tablet Take 1 tablet (500 mg total) by mouth 3 (three) times daily. (Patient not taking: Reported on 04/18/2018)   [DISCONTINUED] amoxicillin  (AMOXIL ) 500 MG tablet One po Tid for 10 days   [DISCONTINUED] FLUoxetine (PROZAC) 10 MG tablet Take 10 mg by mouth daily.   No facility-administered encounter medications on file as of 11/26/2023.    Past Medical History:  Diagnosis Date   Anxiety    Asthma    Hypothyroidism     History reviewed. No pertinent surgical history.  History reviewed. No pertinent family history.  Social History   Socioeconomic History   Marital status: Married    Spouse name: Not on file   Number of children: Not on file   Years of education: Not on file   Highest  education level: Not on file  Occupational History   Not on file  Tobacco Use   Smoking status: Never   Smokeless tobacco: Never  Vaping Use   Vaping status: Never Used  Substance and Sexual Activity   Alcohol use: No   Drug use: No   Sexual activity: Not on file  Other Topics Concern   Not on file  Social History Narrative   Not on file   Social Drivers of Health   Financial Resource Strain: Not on file  Food Insecurity: Not on file  Transportation Needs: Not on file  Physical Activity: Not on file  Stress: Not on file  Social Connections: Not on file  Intimate Partner Violence: Not on file    Review of Systems  Constitutional:  Positive for malaise/fatigue. Negative for fever and weight loss.  Cardiovascular:  Negative for chest pain and palpitations.  Gastrointestinal:  Positive for diarrhea and nausea. Negative for abdominal pain, blood in stool, constipation and vomiting.  Genitourinary:  Negative for dysuria and hematuria.  Neurological:  Negative for tremors and headaches.  Psychiatric/Behavioral:  Negative for depression. The patient is not nervous/anxious.      Objective    BP 117/74   Pulse 99   Temp 99.8 F (37.7 C)   Ht 6' (1.829 m)   Wt 170 lb (77.1 kg)   SpO2 96%   BMI 23.06 kg/m   Physical  Exam Constitutional:      Appearance: Normal appearance. He is normal weight.  HENT:     Head: Normocephalic and atraumatic.     Mouth/Throat:     Mouth: Mucous membranes are moist.     Pharynx: Oropharynx is clear.  Eyes:     Extraocular Movements: Extraocular movements intact.     Conjunctiva/sclera: Conjunctivae normal.  Cardiovascular:     Rate and Rhythm: Normal rate and regular rhythm.     Heart sounds: Normal heart sounds. No murmur heard. Pulmonary:     Effort: Pulmonary effort is normal.     Breath sounds: Normal breath sounds. No wheezing or rales.  Abdominal:     General: Abdomen is flat. Bowel sounds are normal. There is no distension.      Palpations: Abdomen is soft.     Tenderness: There is no abdominal tenderness. There is no guarding or rebound.  Genitourinary:    Pubic Area: No rash or pubic lice.      Penis: Circumcised. Lesions present. No erythema, tenderness, discharge or swelling.     Skin:    General: Skin is warm and dry.  Neurological:     General: No focal deficit present.     Mental Status: He is alert and oriented to person, place, and time.  Psychiatric:        Mood and Affect: Mood normal.        Behavior: Behavior normal.       Assessment & Plan:  Encounter to establish care  Acquired hypothyroidism Assessment & Plan: Stable. Denies symptomatology. TSH and T4 today. Will refill levothyroxine  based on lab results.   Orders: -     TSH + free T4  Generalized anxiety disorder Assessment & Plan: Stable, doing well on Lexapro . Medication refilled today.   Orders: -     Escitalopram  Oxalate; Take 1 tablet (20 mg total) by mouth daily.  Dispense: 90 tablet; Refill: 2  Gastroenteritis Assessment & Plan: Likely self-resolving infection, exam benign. CBC and CMP today.  Maintain good hydration, frequent small amounts of oral rehydration solution. Return for signs of dehydration, urinating less than 3 times in 24 hours.  Return if blood develops in stool or fever lasts longer than 5 days.  Hand washing to prevent spread of infection.    Orders: -     CBC with Differential/Platelet -     Comprehensive metabolic panel with GFR  Penile lesion Assessment & Plan: Patient presents today with concerns for penile lesions x 2 weeks. 3-4 vesicular lesions with surrounding erythema present on distal aspect of penile shaft. No tenderness or penile discharge. Comprehensive STD testing today. Discussed supportive care and safe sex practices.   Orders: -     RPR -     HSV 1 and 2 Ab, IgG  Screen for STD (sexually transmitted disease) -     Chlamydia/Gonococcus/Trichomonas, NAA -     RPR -     HIV  Antibody (routine testing w rflx) -     HSV 1 and 2 Ab, IgG    No follow-ups on file.   Charmaine Kiffany Schelling, PA-C

## 2023-11-27 LAB — CBC WITH DIFFERENTIAL/PLATELET
Basophils Absolute: 0 x10E3/uL (ref 0.0–0.2)
Basos: 0 %
EOS (ABSOLUTE): 0.1 x10E3/uL (ref 0.0–0.4)
Eos: 1 %
Hematocrit: 42.3 % (ref 37.5–51.0)
Hemoglobin: 14.4 g/dL (ref 13.0–17.7)
Immature Grans (Abs): 0 x10E3/uL (ref 0.0–0.1)
Immature Granulocytes: 0 %
Lymphocytes Absolute: 0.8 x10E3/uL (ref 0.7–3.1)
Lymphs: 12 %
MCH: 30.3 pg (ref 26.6–33.0)
MCHC: 34 g/dL (ref 31.5–35.7)
MCV: 89 fL (ref 79–97)
Monocytes Absolute: 0.6 x10E3/uL (ref 0.1–0.9)
Monocytes: 9 %
Neutrophils Absolute: 5.1 x10E3/uL (ref 1.4–7.0)
Neutrophils: 78 %
Platelets: 189 x10E3/uL (ref 150–450)
RBC: 4.76 x10E6/uL (ref 4.14–5.80)
RDW: 12.6 % (ref 11.6–15.4)
WBC: 6.6 x10E3/uL (ref 3.4–10.8)

## 2023-11-27 LAB — COMPREHENSIVE METABOLIC PANEL WITH GFR
ALT: 15 IU/L (ref 0–44)
AST: 21 IU/L (ref 0–40)
Albumin: 4.5 g/dL (ref 4.1–5.1)
Alkaline Phosphatase: 66 IU/L (ref 44–121)
BUN/Creatinine Ratio: 11 (ref 9–20)
BUN: 10 mg/dL (ref 6–24)
Bilirubin Total: 1 mg/dL (ref 0.0–1.2)
CO2: 20 mmol/L (ref 20–29)
Calcium: 9.2 mg/dL (ref 8.7–10.2)
Chloride: 101 mmol/L (ref 96–106)
Creatinine, Ser: 0.95 mg/dL (ref 0.76–1.27)
Globulin, Total: 2.5 g/dL (ref 1.5–4.5)
Glucose: 95 mg/dL (ref 70–99)
Potassium: 3.5 mmol/L (ref 3.5–5.2)
Sodium: 137 mmol/L (ref 134–144)
Total Protein: 7 g/dL (ref 6.0–8.5)
eGFR: 102 mL/min/1.73 (ref >59)

## 2023-11-27 LAB — HIV ANTIBODY (ROUTINE TESTING W REFLEX): HIV Screen 4th Generation wRfx: NONREACTIVE

## 2023-11-27 LAB — RPR: RPR Ser Ql: NONREACTIVE

## 2023-11-27 LAB — HSV 1 AND 2 AB, IGG
HSV 1 Glycoprotein G Ab, IgG: REACTIVE — AB
HSV 2 IgG, Type Spec: NONREACTIVE

## 2023-11-27 LAB — TSH+FREE T4
Free T4: 1.18 ng/dL (ref 0.82–1.77)
TSH: 0.822 u[IU]/mL (ref 0.450–4.500)

## 2023-11-28 MED ORDER — LEVOTHYROXINE SODIUM 75 MCG PO TABS: 75.0000 ug | ORAL_TABLET | Freq: Every day | ORAL | 1 refills | Status: AC

## 2023-11-28 NOTE — Addendum Note (Signed)
 Addended by: MANCIL PFEIFFER on: 11/28/2023 12:42 PM   Modules accepted: Orders

## 2023-11-29 ENCOUNTER — Ambulatory Visit: Payer: Self-pay | Admitting: Physician Assistant

## 2023-11-30 LAB — CHLAMYDIA/GONOCOCCUS/TRICHOMONAS, NAA
Chlamydia by NAA: NEGATIVE
Gonococcus by NAA: NEGATIVE
Trich vag by NAA: NEGATIVE

## 2023-11-30 LAB — SPECIMEN STATUS REPORT

## 2024-05-29 ENCOUNTER — Ambulatory Visit: Payer: Self-pay | Admitting: Physician Assistant
